# Patient Record
Sex: Male | Born: 1995 | Race: Black or African American | Hispanic: No | Marital: Single | Smoking: Current some day smoker
Health system: Southern US, Community
[De-identification: ages and names within clinical notes are randomized; demographics above are authoritative.]

## PROBLEM LIST (undated history)

## (undated) DIAGNOSIS — S86012A Strain of left Achilles tendon, initial encounter: Secondary | ICD-10-CM

## (undated) DIAGNOSIS — T7840XA Allergy, unspecified, initial encounter: Secondary | ICD-10-CM

## (undated) DIAGNOSIS — J45909 Unspecified asthma, uncomplicated: Secondary | ICD-10-CM

## (undated) DIAGNOSIS — F329 Major depressive disorder, single episode, unspecified: Secondary | ICD-10-CM

## (undated) DIAGNOSIS — F988 Other specified behavioral and emotional disorders with onset usually occurring in childhood and adolescence: Secondary | ICD-10-CM

## (undated) DIAGNOSIS — F32A Depression, unspecified: Secondary | ICD-10-CM

## (undated) DIAGNOSIS — F419 Anxiety disorder, unspecified: Secondary | ICD-10-CM

## (undated) HISTORY — DX: Depression, unspecified: F32.A

## (undated) HISTORY — DX: Other specified behavioral and emotional disorders with onset usually occurring in childhood and adolescence: F98.8

## (undated) HISTORY — DX: Major depressive disorder, single episode, unspecified: F32.9

## (undated) HISTORY — PX: SHOULDER ARTHROSCOPY W/ LABRAL REPAIR: SHX2399

## (undated) HISTORY — DX: Anxiety disorder, unspecified: F41.9

---
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
## 2013-01-04 ENCOUNTER — Telehealth: Payer: Self-pay | Admitting: Nurse Practitioner

## 2013-01-04 MED ORDER — LISDEXAMFETAMINE DIMESYLATE 50 MG PO CAPS: 50.0000 mg | ORAL_CAPSULE | ORAL | Status: DC

## 2013-01-04 NOTE — Telephone Encounter (Signed)
Aware. 

## 2013-01-04 NOTE — Telephone Encounter (Signed)
rx ready for pick up- government controlled and we cannot send electronically nor call in

## 2013-03-23 NOTE — Telephone Encounter (Signed)
11/11 Patient's RX for Vyvanse was sent to front and notified by Baruch GoutyJanie Stone

## 2013-03-24 ENCOUNTER — Telehealth: Payer: Self-pay | Admitting: Nurse Practitioner

## 2013-03-24 MED ORDER — LISDEXAMFETAMINE DIMESYLATE 50 MG PO CAPS: 50.0000 mg | ORAL_CAPSULE | ORAL | Status: DC

## 2013-03-24 NOTE — Telephone Encounter (Signed)
Mom here to pick up rx  

## 2013-05-09 ENCOUNTER — Telehealth: Payer: Self-pay | Admitting: Nurse Practitioner

## 2013-05-09 NOTE — Telephone Encounter (Signed)
appt given  

## 2013-05-09 NOTE — Telephone Encounter (Signed)
ntbs befroe refill- has not been seen for adhd in almost a year

## 2013-05-11 ENCOUNTER — Ambulatory Visit (INDEPENDENT_AMBULATORY_CARE_PROVIDER_SITE_OTHER): Payer: BC Managed Care – PPO | Admitting: Nurse Practitioner

## 2013-05-11 ENCOUNTER — Encounter: Payer: Self-pay | Admitting: Nurse Practitioner

## 2013-05-11 VITALS — BP 128/79 | HR 69 | Temp 97.3°F | Ht 70.25 in | Wt 169.8 lb

## 2013-05-11 DIAGNOSIS — Z1389 Encounter for screening for other disorder: Principal | ICD-10-CM

## 2013-05-11 DIAGNOSIS — Z1339 Encounter for screening examination for other mental health and behavioral disorders: Secondary | ICD-10-CM

## 2013-05-11 DIAGNOSIS — F418 Other specified anxiety disorders: Secondary | ICD-10-CM

## 2013-05-11 DIAGNOSIS — F341 Dysthymic disorder: Secondary | ICD-10-CM

## 2013-05-11 DIAGNOSIS — F909 Attention-deficit hyperactivity disorder, unspecified type: Secondary | ICD-10-CM

## 2013-05-11 MED ORDER — BUPROPION HCL ER (XL) 150 MG PO TB24: 150.0000 mg | ORAL_TABLET | Freq: Two times a day (BID) | ORAL | Status: DC

## 2013-05-11 MED ORDER — LISDEXAMFETAMINE DIMESYLATE 50 MG PO CAPS: 50.0000 mg | ORAL_CAPSULE | ORAL | Status: DC

## 2013-05-11 NOTE — Patient Instructions (Signed)
Attention Deficit Hyperactivity Disorder Attention deficit hyperactivity disorder (ADHD) is a problem with behavior issues based on the way the brain functions (neurobehavioral disorder). It is a common reason for behavior and academic problems in school. SYMPTOMS  There are 3 types of ADHD. The 3 types and some of the symptoms include:  Inattentive  Gets bored or distracted easily.  Loses or forgets things. Forgets to hand in homework.  Has trouble organizing or completing tasks.  Difficulty staying on task.  An inability to organize daily tasks and school work.  Leaving projects, chores, or homework unfinished.  Trouble paying attention or responding to details. Careless mistakes.  Difficulty following directions. Often seems like is not listening.  Dislikes activities that require sustained attention (like chores or homework).  Hyperactive-impulsive  Feels like it is impossible to sit still or stay in a seat. Fidgeting with hands and feet.  Trouble waiting turn.  Talking too much or out of turn. Interruptive.  Speaks or acts impulsively.  Aggressive, disruptive behavior.  Constantly busy or on the go, noisy.  Often leaves seat when they are expected to remain seated.  Often runs or climbs where it is not appropriate, or feels very restless.  Combined  Has symptoms of both of the above. Often children with ADHD feel discouraged about themselves and with school. They often perform well below their abilities in school. As children get older, the excess motor activities can calm down, but the problems with paying attention and staying organized persist. Most children do not outgrow ADHD but with good treatment can learn to cope with the symptoms. DIAGNOSIS  When ADHD is suspected, the diagnosis should be made by professionals trained in ADHD. This professional will collect information about the individual suspected of having ADHD. Information must be collected from  various settings where the person lives, works, or attends school.  Diagnosis will include:  Confirming symptoms began in childhood.  Ruling out other reasons for the child's behavior.  The health care providers will check with the child's school and check their medical records.  They will talk to teachers and parents.  Behavior rating scales for the child will be filled out by those dealing with the child on a daily basis. A diagnosis is made only after all information has been considered. TREATMENT  Treatment usually includes behavioral treatment, tutoring or extra support in school, and stimulant medicines. Because of the way a person's brain works with ADHD, these medicines decrease impulsivity and hyperactivity and increase attention. This is different than how they would work in a person who does not have ADHD. Other medicines used include antidepressants and certain blood pressure medicines. Most experts agree that treatment for ADHD should address all aspects of the person's functioning. Along with medicines, treatment should include structured classroom management at school. Parents should reward good behavior, provide constant discipline, and limit-setting. Tutoring should be available for the child as needed. ADHD is a life-long condition. If untreated, the disorder can have long-term serious effects into adolescence and adulthood. HOME CARE INSTRUCTIONS   Often with ADHD there is a lot of frustration among family members dealing with the condition. Blame and anger are also feelings that are common. In many cases, because the problem affects the family as a whole, the entire family may need help. A therapist can help the family find better ways to handle the disruptive behaviors of the person with ADHD and promote change. If the person with ADHD is young, most of the therapist's work   is with the parents. Parents will learn techniques for coping with and improving their child's  behavior. Sometimes only the child with the ADHD needs counseling. Your health care providers can help you make these decisions.  Children with ADHD may need help learning how to organize. Some helpful tips include:  Keep routines the same every day from wake-up time to bedtime. Schedule all activities, including homework and playtime. Keep the schedule in a place where the person with ADHD will often see it. Mark schedule changes as far in advance as possible.  Schedule outdoor and indoor recreation.  Have a place for everything and keep everything in its place. This includes clothing, backpacks, and school supplies.  Encourage writing down assignments and bringing home needed books. Work with your child's teachers for assistance in organizing school work.  Offer your child a well-balanced diet. Breakfast that includes a balance of whole grains, protein and, fruits or vegetables is especially important for school performance. Children should avoid drinks with caffeine including:  Soft drinks.  Coffee.  Tea.  However, some older children (adolescents) may find these drinks helpful in improving their attention. Because it can also be common for adolescents with ADHD to become addicted to caffeine, talk with your health care provider about what is a safe amount of caffeine intake for your child.  Children with ADHD need consistent rules that they can understand and follow. If rules are followed, give small rewards. Children with ADHD often receive, and expect, criticism. Look for good behavior and praise it. Set realistic goals. Give clear instructions. Look for activities that can foster success and self-esteem. Make time for pleasant activities with your child. Give lots of affection.  Parents are their children's greatest advocates. Learn as much as possible about ADHD. This helps you become a stronger and better advocate for your child. It also helps you educate your child's teachers and  instructors if they feel inadequate in these areas. Parent support groups are often helpful. A national group with local chapters is called Children and Adults with Attention Deficit Hyperactivity Disorder (CHADD). SEEK MEDICAL CARE IF:  Your child has repeated muscle twitches, cough or speech outbursts.  Your child has sleep problems.  Your child has a marked loss of appetite.  Your child develops depression.  Your child has new or worsening behavioral problems.  Your child develops dizziness.  Your child has a racing heart.  Your child has stomach pains.  Your child develops headaches. SEEK IMMEDIATE MEDICAL CARE IF:  Your child has been diagnosed with depression or anxiety and the symptoms seem to be getting worse.  Your child has been depressed and suddenly appears to have increased energy or motivation.  You are worried that your child is having a bad reaction to a medication he or she is taking for ADHD. Document Released: 02/06/2002 Document Revised: 12/07/2012 Document Reviewed: 10/24/2012 Exit[employer] Patient Information 2014 [employer].  

## 2013-05-11 NOTE — Progress Notes (Addendum)
   Subjective:    Patient ID: Ray Chavez, male    DOB: 1995-07-04, 18 y.o.   MRN: [id number]  HPI Patient brought in today by himself for follow up of ADHA. Currently taking vyvanse 50mg  and  daily. Behavior- good Grades-good Medication side effects-none Weight loss- none Sleeping habits- good Any concerns- none  Also depression- takes wellbutrin daily- no side effects keeps him from feelong down and depressed.    Review of Systems  Constitutional: Negative.   HENT: Negative.   Respiratory: Negative.   Cardiovascular: Negative.   Hematological: Negative.   All other systems reviewed and are negative.       Objective:   Physical Exam  Constitutional: He appears well-developed and well-nourished.  Cardiovascular: Normal rate, regular rhythm and normal heart sounds.   Pulmonary/Chest: Effort normal and breath sounds normal.  Skin: Skin is warm.  Psychiatric: He has a normal mood and affect. His behavior is normal. Judgment and thought content normal.    BP 128/79  Pulse 69  Temp(Src) 97.3 F (36.3 C) (Oral)  Ht 5' 10.25" (1.784 m)  Wt 169 lb 12.8 oz (77.021 kg)  BMI 24.20 kg/m2       Assessment & Plan:   1. ADHD (attention deficit hyperactivity disorder) evaluation   2. Depression with anxiety    Meds ordered this encounter  Medications  . lisdexamfetamine (VYVANSE) 50 MG capsule    Sig: Take 1 capsule (50 mg total) by mouth every morning.    Dispense:  30 capsule    Refill:  0    Do not fill till 06/10/13    Order Specific Question:  Supervising Provider    Answer:  Ernestina PennaMOORE, DONALD W [[id number]]  . buPROPion (WELLBUTRIN XL) 150 MG 24 hr tablet    Sig: Take 1 tablet (150 mg total) by mouth 2 (two) times daily.    Dispense:  30 tablet    Refill:  3    Order Specific Question:  Supervising Provider    Answer:  Ernestina PennaMOORE, DONALD W [[id number]]  . lisdexamfetamine (VYVANSE) 50 MG capsule    Sig: Take 1 capsule (50 mg total) by mouth every morning.    Dispense:  30  capsule    Refill:  0    Order Specific Question:  Supervising Provider    Answer:  Deborra MedinaMOORE, DONALD W [[id number]]   Meds as prescribed Behavior modification as needed Follow-up for recheck in 2 months Ray Daphine DeutscherMartin, FNP

## 2013-06-15 ENCOUNTER — Telehealth: Payer: Self-pay | Admitting: Nurse Practitioner

## 2013-06-15 MED ORDER — BUPROPION HCL ER (XL) 150 MG PO TB24: 150.0000 mg | ORAL_TABLET | Freq: Two times a day (BID) | ORAL | Status: DC

## 2013-07-28 ENCOUNTER — Other Ambulatory Visit: Payer: Self-pay

## 2013-07-28 MED ORDER — FLUOXETINE HCL 20 MG PO TABS: 20.0000 mg | ORAL_TABLET | Freq: Every day | ORAL | Status: DC

## 2013-07-28 NOTE — Telephone Encounter (Signed)
Last seen 05/11/13 MMM This med not in [facility] list

## 2013-07-31 ENCOUNTER — Other Ambulatory Visit: Payer: Self-pay | Admitting: Nurse Practitioner

## 2013-09-12 ENCOUNTER — Ambulatory Visit (INDEPENDENT_AMBULATORY_CARE_PROVIDER_SITE_OTHER): Payer: Self-pay | Admitting: Nurse Practitioner

## 2013-09-12 VITALS — BP 122/78 | HR 63 | Temp 97.3°F | Ht 70.28 in

## 2013-09-12 DIAGNOSIS — M25512 Pain in left shoulder: Secondary | ICD-10-CM

## 2013-09-12 DIAGNOSIS — Z7251 High risk heterosexual behavior: Secondary | ICD-10-CM

## 2013-09-12 DIAGNOSIS — M25519 Pain in unspecified shoulder: Secondary | ICD-10-CM

## 2013-09-12 DIAGNOSIS — R5381 Other malaise: Secondary | ICD-10-CM

## 2013-09-12 DIAGNOSIS — R5383 Other fatigue: Secondary | ICD-10-CM

## 2013-09-12 DIAGNOSIS — R5382 Chronic fatigue, unspecified: Secondary | ICD-10-CM

## 2013-09-12 NOTE — Progress Notes (Signed)
   Subjective:    Patient ID: Ray Chavez, male    DOB: [birth year], 18 y.o.   MRN: [id number]  HPI Patient in with mu;tiple complaints: * left shoulder tingling - like pins and needles sticking in it- had torn labrium surgery in thst shoulder over a year ago- This current sensation started about 2 months ago- getting worse-constant- movement makes it some worse.- neck pian and headaches when turns head to the left- no OTC meds * Fatigue- stays that way all the time- started about 3-4 months ago- sleeps about 7-8 hours a night- mom says that he snores. *STD- high risk sexual behavior- mom wants him checked.   Review of Systems  Constitutional: Positive for appetite change (less) and fatigue. Negative for fever and chills.  Respiratory: Negative.   Cardiovascular: Negative.   Gastrointestinal: Positive for diarrhea.  Musculoskeletal: Positive for myalgias and neck pain.  Neurological: Positive for headaches.  Psychiatric/Behavioral: Negative.   All other systems reviewed and are negative.      Objective:   Physical Exam  Constitutional: He is oriented to person, place, and time. He appears well-developed and well-nourished.  HENT:  Right Ear: External ear normal.  Left Ear: External ear normal.  Eyes: Pupils are equal, round, and reactive to light.  Neck: Normal range of motion.  Cardiovascular: Normal rate, regular rhythm and normal heart sounds.   Pulmonary/Chest: Effort normal and breath sounds normal.  Abdominal: Soft. Bowel sounds are normal.  Musculoskeletal:  FROM of left shoulder with tingling sensation on internal and external rotation. Grips equal bil  Neurological: He is alert and oriented to person, place, and time. He has normal reflexes.  Skin: Skin is warm and dry.  Psychiatric: He has a normal mood and affect. His behavior is normal. Judgment and thought content normal.   BP 122/78  Pulse 63  Temp(Src) 97.3 F (36.3 C) (Oral)  Ht 5' 10.28" (1.785 m)  SpO2  97%        Assessment & Plan:  1. Chronic fatigue Labs pending Rest Force fluids - Anemia Profile B - CMP14+EGFR - Lyme Ab/Western Blot Reflex - Rocky mtn spotted fvr abs pnl(IgG+IgM) - Thyroid Panel With TSH - Epstein-Barr virus VCA antibody panel  2. Left shoulder pain If hurts don't do it - Ambulatory referral to Orthopedic Surgery  3. High risk sexual behavior Labs pending - STD Panel (HBSAG,HIV,RPR)   Mary-Margaret Hassell Done, FNP

## 2013-09-12 NOTE — Patient Instructions (Signed)

## 2013-09-14 LAB — ANEMIA PROFILE B
BASOS ABS: 0 10*3/uL (ref 0.0–0.2)
Basos: 0 %
EOS ABS: 0.1 10*3/uL (ref 0.0–0.4)
Eos: 3 %
FERRITIN: 150 ng/mL — AB (ref 16–124)
Folate: 9.3 ng/mL (ref >3.0)
HCT: 45.7 % (ref 37.5–51.0)
Hemoglobin: 15.8 g/dL (ref 12.6–17.7)
IMMATURE GRANULOCYTES: 0 %
IRON SATURATION: 57 % — AB (ref 15–55)
IRON: 181 ug/dL — AB (ref 40–155)
Immature Grans (Abs): 0 10*3/uL (ref 0.0–0.1)
LYMPHS ABS: 1.9 10*3/uL (ref 0.7–3.1)
Lymphs: 39 %
MCH: 31.1 pg (ref 26.6–33.0)
MCHC: 34.6 g/dL (ref 31.5–35.7)
MCV: 90 fL (ref 79–97)
MONOCYTES: 9 %
MONOS ABS: 0.4 10*3/uL (ref 0.1–0.9)
Neutrophils Absolute: 2.3 10*3/uL (ref 1.4–7.0)
Neutrophils Relative %: 49 %
PLATELETS: 237 10*3/uL (ref 150–379)
RBC: 5.08 x10E6/uL (ref 4.14–5.80)
RDW: 12.6 % (ref 12.3–15.4)
Retic Ct Pct: 1.3 % (ref 0.6–2.6)
TIBC: 317 ug/dL (ref 250–450)
UIBC: 136 ug/dL — ABNORMAL LOW (ref 150–375)
Vitamin B-12: 702 pg/mL (ref 211–946)
WBC: 4.8 10*3/uL (ref 3.4–10.8)

## 2013-09-14 LAB — CMP14+EGFR
A/G RATIO: 2.1 (ref 1.1–2.5)
ALK PHOS: 65 IU/L (ref 39–117)
ALT: 44 IU/L (ref 0–44)
AST: 40 IU/L (ref 0–40)
Albumin: 4.7 g/dL (ref 3.5–5.5)
BILIRUBIN TOTAL: 0.6 mg/dL (ref 0.0–1.2)
BUN / CREAT RATIO: 10 (ref 8–19)
BUN: 12 mg/dL (ref 6–20)
CHLORIDE: 99 mmol/L (ref 97–108)
CO2: 23 mmol/L (ref 18–29)
Calcium: 9.9 mg/dL (ref 8.7–10.2)
Creatinine, Ser: 1.22 mg/dL (ref 0.76–1.27)
GFR calc non Af Amer: 85 mL/min/{1.73_m2} (ref >59)
GFR, EST AFRICAN AMERICAN: 99 mL/min/{1.73_m2} (ref >59)
Globulin, Total: 2.2 g/dL (ref 1.5–4.5)
Glucose: 70 mg/dL (ref 65–99)
POTASSIUM: 4 mmol/L (ref 3.5–5.2)
Sodium: 140 mmol/L (ref 134–144)
Total Protein: 6.9 g/dL (ref 6.0–8.5)

## 2013-09-14 LAB — EPSTEIN-BARR VIRUS VCA ANTIBODY PANEL
EBV AB VCA, IGG: 28 U/mL — AB (ref 0.0–17.9)
EBV Early Antigen Ab, IgG: <9 U/mL (ref 0.0–8.9)
EBV NUCLEAR ANTIGEN AB, IGG: 77.3 U/mL — AB (ref 0.0–17.9)

## 2013-09-14 LAB — ROCKY MTN SPOTTED FVR ABS PNL(IGG+IGM)
RMSF IgG: NEGATIVE
RMSF IgM: 0.4 index (ref 0.00–0.89)

## 2013-09-14 LAB — LYME AB/WESTERN BLOT REFLEX: LYME DISEASE AB, QUANT, IGM: <0.8 index (ref 0.00–0.79)

## 2013-09-14 LAB — STD PANEL
HIV 1/O/2 Abs-Index Value: <1 (ref <1.00)
HIV-1/HIV-2 Ab: NONREACTIVE
Hepatitis B Surface Ag: NEGATIVE
RPR: NONREACTIVE

## 2013-09-14 LAB — THYROID PANEL WITH TSH
FREE THYROXINE INDEX: 1.8 (ref 1.2–4.9)
T3 Uptake Ratio: 28 % (ref 24–39)
T4 TOTAL: 6.4 ug/dL (ref 4.5–12.0)
TSH: 2.63 u[IU]/mL (ref 0.450–4.500)

## 2013-09-15 ENCOUNTER — Telehealth: Payer: Self-pay | Admitting: Family Medicine

## 2013-09-15 NOTE — Telephone Encounter (Signed)
Message copied by Azalee CourseFULP, Dorethea Strubel on Fri Sep 15, 2013 10:02 AM ------      Message from: Bennie PieriniMARTIN, MARY-MARGARET      Created: Fri Sep 15, 2013  9:19 AM       Anemia panel normal      Kidney and liver function stable      Thyroid normal      Lyme negative      RMSF negative      Mono titer show positive- but looks like he has had for awhile and is resolving      STD panel negative ------

## 2013-09-21 ENCOUNTER — Encounter: Payer: Self-pay | Admitting: *Deleted

## 2013-09-26 ENCOUNTER — Telehealth: Payer: Self-pay | Admitting: Nurse Practitioner

## 2013-09-26 NOTE — Telephone Encounter (Signed)
Patient aware of labs.  

## 2013-09-26 NOTE — Telephone Encounter (Signed)
Ac lmtcb 7/28

## 2013-09-27 ENCOUNTER — Other Ambulatory Visit: Payer: Self-pay | Admitting: Nurse Practitioner

## 2013-12-13 ENCOUNTER — Other Ambulatory Visit: Payer: Self-pay | Admitting: Nurse Practitioner

## 2013-12-14 NOTE — Telephone Encounter (Signed)
Pt has Rx for Wellbutrin and Prozac. Last ov 7/15 with labs.

## 2014-04-17 ENCOUNTER — Telehealth: Payer: Self-pay | Admitting: Nurse Practitioner

## 2014-04-17 NOTE — Telephone Encounter (Signed)
Just schedule whenever you can

## 2014-04-17 NOTE — Telephone Encounter (Signed)
Is it ok to schedule him one night you are working late.

## 2014-04-17 NOTE — Telephone Encounter (Signed)
Pt's mother called back, given appt with MMM 2/23 at 5:45.

## 2014-04-24 ENCOUNTER — Encounter: Payer: Self-pay | Admitting: Nurse Practitioner

## 2014-04-24 ENCOUNTER — Ambulatory Visit (INDEPENDENT_AMBULATORY_CARE_PROVIDER_SITE_OTHER): Payer: Managed Care, Other (non HMO) | Admitting: Nurse Practitioner

## 2014-04-24 VITALS — BP 134/88 | HR 80 | Temp 98.3°F | Ht 70.25 in | Wt 188.6 lb

## 2014-04-24 DIAGNOSIS — Z1339 Encounter for screening examination for other mental health and behavioral disorders: Secondary | ICD-10-CM

## 2014-04-24 DIAGNOSIS — Z1389 Encounter for screening for other disorder: Principal | ICD-10-CM

## 2014-04-24 DIAGNOSIS — F909 Attention-deficit hyperactivity disorder, unspecified type: Secondary | ICD-10-CM

## 2014-04-24 MED ORDER — LISDEXAMFETAMINE DIMESYLATE 40 MG PO CAPS: 40.0000 mg | ORAL_CAPSULE | ORAL | Status: AC

## 2014-04-24 NOTE — Progress Notes (Signed)
   Subjective:    Patient ID: Ray Chavez, male    DOB: [birth year], 19 y.o.   MRN: [id number]  HPI Patient brought in today by self for follow up of ADHD. Currently taking vyvanse50mg - has not taken in several months.Starting to have problems in school. Behavior-good Grades- dropping Medication side effects- none Weight loss- when takes [employer] Sleeping habits- good Any concerns- thinks that may need to go down in dose- thinks was a little strong for him.     Review of Systems  Constitutional: Negative.   HENT: Negative.   Respiratory: Negative.   Cardiovascular: Negative.   Gastrointestinal: Negative.   Genitourinary: Negative.   Neurological: Negative.   Psychiatric/Behavioral: Negative.   All other systems reviewed and are negative.      Objective:   Physical Exam  Constitutional: He is oriented to person, place, and time. He appears well-developed and well-nourished.  Cardiovascular: Normal rate, regular rhythm and normal heart sounds.   Pulmonary/Chest: Effort normal and breath sounds normal.  Neurological: He is alert and oriented to person, place, and time.  Skin: Skin is warm and dry.  Psychiatric: He has a normal mood and affect. His behavior is normal. Judgment and thought content normal.     BP 134/88 mmHg  Pulse 80  Temp(Src) 98.3 F (36.8 C) (Oral)  Ht 5' 10.25" (1.784 m)  Wt 188 lb 9.6 oz (85.548 kg)  BMI 26.88 kg/m2       Assessment & Plan:   1. ADHD (attention deficit hyperactivity disorder) evaluation    Meds ordered this encounter  Medications  . lisdexamfetamine (VYVANSE) 40 MG capsule    Sig: Take 1 capsule (40 mg total) by mouth every morning.    Dispense:  30 capsule    Refill:  0    Order Specific Question:  Supervising Provider    Answer:  Ernestina PennaMOORE, DONALD W [[id number]]  . lisdexamfetamine (VYVANSE) 40 MG capsule    Sig: Take 1 capsule (40 mg total) by mouth every morning.    Dispense:  30 capsule    Refill:  0    DO NOT FILL TILL  05/22/14    Order Specific Question:  Supervising Provider    Answer:  Ernestina PennaMOORE, DONALD W [[id number]]   Stress management rto in 2 months  Mary-Margaret Daphine DeutscherMartin, FNP

## 2015-07-30 ENCOUNTER — Telehealth: Payer: Self-pay | Admitting: Nurse Practitioner

## 2015-07-31 NOTE — Telephone Encounter (Signed)
Faxed shot record to [facility] @ [phone] and left copy of shot record left up front

## 2019-10-06 ENCOUNTER — Emergency Department (HOSPITAL_COMMUNITY)
Admission: EM | Admit: 2019-10-06 | Discharge: 2019-10-07 | Disposition: A | Discharge status: Home / Self Care | Payer: Managed Care, Other (non HMO) | Attending: Emergency Medicine | Admitting: Emergency Medicine

## 2019-10-06 ENCOUNTER — Emergency Department (HOSPITAL_COMMUNITY): Payer: Managed Care, Other (non HMO)

## 2019-10-06 ENCOUNTER — Encounter (HOSPITAL_COMMUNITY): Payer: Self-pay | Admitting: Emergency Medicine

## 2019-10-06 ENCOUNTER — Other Ambulatory Visit: Payer: Self-pay

## 2019-10-06 DIAGNOSIS — J1282 Pneumonia due to coronavirus disease 2019: Secondary | ICD-10-CM | POA: Insufficient documentation

## 2019-10-06 DIAGNOSIS — R45851 Suicidal ideations: Secondary | ICD-10-CM

## 2019-10-06 DIAGNOSIS — R079 Chest pain, unspecified: Secondary | ICD-10-CM | POA: Diagnosis present

## 2019-10-06 DIAGNOSIS — U071 COVID-19: Secondary | ICD-10-CM | POA: Diagnosis not present

## 2019-10-06 DIAGNOSIS — F17200 Nicotine dependence, unspecified, uncomplicated: Secondary | ICD-10-CM | POA: Insufficient documentation

## 2019-10-06 DIAGNOSIS — J45909 Unspecified asthma, uncomplicated: Secondary | ICD-10-CM | POA: Insufficient documentation

## 2019-10-06 LAB — BASIC METABOLIC PANEL
Anion gap: 14 (ref 5–15)
BUN: 10 mg/dL (ref 6–20)
CO2: 23 mmol/L (ref 22–32)
Calcium: 9.3 mg/dL (ref 8.9–10.3)
Chloride: 96 mmol/L — ABNORMAL LOW (ref 98–111)
Creatinine, Ser: 1.28 mg/dL — ABNORMAL HIGH (ref 0.61–1.24)
GFR calc Af Amer: >60 mL/min (ref >60)
GFR calc non Af Amer: >60 mL/min (ref >60)
Glucose, Bld: 100 mg/dL — ABNORMAL HIGH (ref 70–99)
Potassium: 3.5 mmol/L (ref 3.5–5.1)
Sodium: 133 mmol/L — ABNORMAL LOW (ref 135–145)

## 2019-10-06 LAB — CBC
HCT: 47 % (ref 39.0–52.0)
Hemoglobin: 16.6 g/dL (ref 13.0–17.0)
MCH: 31.6 pg (ref 26.0–34.0)
MCHC: 35.3 g/dL (ref 30.0–36.0)
MCV: 89.5 fL (ref 80.0–100.0)
Platelets: 177 10*3/uL (ref 150–400)
RBC: 5.25 MIL/uL (ref 4.22–5.81)
RDW: 11.8 % (ref 11.5–15.5)
WBC: 5.2 10*3/uL (ref 4.0–10.5)
nRBC: 0 % (ref 0.0–0.2)

## 2019-10-06 LAB — TROPONIN I (HIGH SENSITIVITY): Troponin I (High Sensitivity): 5 ng/L (ref <18)

## 2019-10-06 MED ORDER — SODIUM CHLORIDE 0.9% FLUSH: 3.0000 mL | Freq: Once | INTRAVENOUS | Status: DC

## 2019-10-06 NOTE — ED Triage Notes (Signed)
Pt c/o chest pain, shortness of breath, cough and nausea/vomiting x 5 days. States he was tested for covid last week, and was negative. Denies covid contacts.

## 2019-10-07 DIAGNOSIS — F322 Major depressive disorder, single episode, severe without psychotic features: Secondary | ICD-10-CM | POA: Insufficient documentation

## 2019-10-07 LAB — SARS CORONAVIRUS 2 BY RT PCR (HOSPITAL ORDER, PERFORMED IN ~~LOC~~ HOSPITAL LAB): SARS Coronavirus 2: POSITIVE — AB

## 2019-10-07 LAB — TROPONIN I (HIGH SENSITIVITY): Troponin I (High Sensitivity): 4 ng/L (ref <18)

## 2019-10-07 MED ORDER — FAMOTIDINE IN NACL 20-0.9 MG/50ML-% IV SOLN: 20.0000 mg | Freq: Once | INTRAVENOUS | Status: DC | PRN

## 2019-10-07 MED ORDER — SODIUM CHLORIDE 0.9 % IV SOLN
1200.0000 mg | Freq: Once | INTRAVENOUS | Status: AC
  Administered 2019-10-07: 1200 mg via INTRAVENOUS
  Filled 2019-10-07: qty 10

## 2019-10-07 MED ORDER — ONDANSETRON 4 MG PO TBDP
4.0000 mg | ORAL_TABLET | Freq: Once | ORAL | Status: AC
  Administered 2019-10-07: 4 mg via ORAL
  Filled 2019-10-07: qty 1

## 2019-10-07 MED ORDER — METHYLPREDNISOLONE SODIUM SUCC 125 MG IJ SOLR: 125.0000 mg | Freq: Once | INTRAMUSCULAR | Status: DC | PRN

## 2019-10-07 MED ORDER — LORAZEPAM 2 MG/ML IJ SOLN
0.5000 mg | Freq: Once | INTRAMUSCULAR | Status: AC
  Administered 2019-10-07: 0.5 mg via INTRAVENOUS
  Filled 2019-10-07: qty 1

## 2019-10-07 MED ORDER — ALBUTEROL SULFATE HFA 108 (90 BASE) MCG/ACT IN AERS: 2.0000 | INHALATION_SPRAY | RESPIRATORY_TRACT | Status: DC | PRN

## 2019-10-07 MED ORDER — EPINEPHRINE 0.3 MG/0.3ML IJ SOAJ: 0.3000 mg | Freq: Once | INTRAMUSCULAR | Status: DC | PRN

## 2019-10-07 MED ORDER — HYDROXYZINE HCL 25 MG PO TABS
25.0000 mg | ORAL_TABLET | Freq: Four times a day (QID) | ORAL | 0 refills | Status: AC
Start: 2019-10-07 — End: ?

## 2019-10-07 MED ORDER — DOXYCYCLINE HYCLATE 100 MG PO TABS
100.0000 mg | ORAL_TABLET | Freq: Once | ORAL | Status: AC
  Administered 2019-10-07: 100 mg via ORAL
  Filled 2019-10-07: qty 1

## 2019-10-07 MED ORDER — DOXYCYCLINE HYCLATE 100 MG PO CAPS
100.0000 mg | ORAL_CAPSULE | Freq: Two times a day (BID) | ORAL | 0 refills | Status: DC
Start: 2019-10-07 — End: 2019-10-07

## 2019-10-07 MED ORDER — ALBUTEROL SULFATE HFA 108 (90 BASE) MCG/ACT IN AERS
2.0000 | INHALATION_SPRAY | Freq: Once | RESPIRATORY_TRACT | Status: AC | PRN
  Administered 2019-10-07: 2 via RESPIRATORY_TRACT
  Filled 2019-10-07: qty 6.7

## 2019-10-07 MED ORDER — DIPHENHYDRAMINE HCL 50 MG/ML IJ SOLN
50.0000 mg | Freq: Once | INTRAMUSCULAR | Status: AC | PRN
  Administered 2019-10-07: 50 mg via INTRAVENOUS
  Filled 2019-10-07: qty 1

## 2019-10-07 MED ORDER — SODIUM CHLORIDE 0.9 % IV SOLN: INTRAVENOUS | Status: DC | PRN

## 2019-10-07 NOTE — ED Provider Notes (Addendum)
[facility] EMERGENCY DEPARTMENT Provider Note  CSN: [id number] Arrival date & time: 10/06/19 2021  Chief Complaint(s) Chest Pain  HPI Ray Chavez is a 24 y.o. male who presents to the emergency department with 1 week of cough, chills, myalgia, posttussive chest pain and shortness of breath with nausea and nonbloody nonbilious emesis.  Patient reports decreased oral intake due to emesis.  No other alleviating or aggravating factors.  Patient denies any overt fevers.  Denies any known sick contacts.  Chest pain is nonradiating nonexertional.  Cough is nonproductive.  No abdominal pain or diarrhea.  No urinary symptoms.  No other physical complaints.  HPI  Past Medical History Past Medical History:  Diagnosis Date  . Achilles rupture, left 09/25/2011  . ADD (attention deficit disorder)   . Allergy   . Anxiety   . Asthma    exercise induced  . Depression    Patient Active Problem List   Diagnosis Date Noted  . Depression with anxiety 06/13/2012  . ADHD (attention deficit hyperactivity disorder) evaluation 05/30/2012  . Achilles rupture, left 09/25/2011   Home Medication(s) Prior to Admission medications   Medication Sig Start Date End Date Taking? Authorizing Provider  buPROPion (WELLBUTRIN XL) 150 MG 24 hr tablet Take 1 tablet (150 mg total) by mouth 2 (two) times daily. Patient not taking: Reported on 04/24/2014 06/15/13   Bennie Pierini, FNP  FLUoxetine (PROZAC) 20 MG capsule TAKE 1 CAPSULE EVERY DAY Patient not taking: Reported on 04/24/2014 12/14/13   Bennie Pierini, FNP  lisdexamfetamine (VYVANSE) 40 MG capsule Take 1 capsule (40 mg total) by mouth every morning. Patient not taking: Reported on 10/07/2019 04/24/14   Bennie Pierini, FNP  lisdexamfetamine (VYVANSE) 40 MG capsule Take 1 capsule (40 mg total) by mouth every morning. Patient not taking: Reported on 10/07/2019 04/24/14   Bennie Pierini, FNP                                                                                                                                     Past Surgical History Past Surgical History:  Procedure Laterality Date  . ACHILLES TENDON SURGERY  09/25/2011   Procedure: ACHILLES TENDON REPAIR;  Surgeon: Eulas Post, MD;  Location: [facility];  Service: Orthopedics;  Laterality: Left;  reair left achilles tendon primary open  . SHOULDER ARTHROSCOPY W/ LABRAL REPAIR     lt 04-2011   Family History Family History  Problem Relation Age of Onset  . Diabetes Father     Social History Social History   Tobacco Use  . Smoking status: Current Some Day Smoker  . Smokeless tobacco: Never Used  Substance Use Topics  . Alcohol use: Yes  . Drug use: No   Allergies Sulfa antibiotics  Review of Systems Review of Systems All other systems are reviewed and are negative for acute change except as noted in the HPI  Physical Exam Vital  Signs  I have reviewed the triage vital signs BP 122/77 (BP Location: Right Arm)   Pulse (!) 112   Temp 99.9 F (37.7 C) (Oral)   Resp (!) 22   SpO2 96%   Physical Exam Vitals reviewed.  Constitutional:      General: He is not in acute distress.    Appearance: He is well-developed. He is not diaphoretic.  HENT:     Head: Normocephalic and atraumatic.     Nose: Nose normal.  Eyes:     General: No scleral icterus.       Right eye: No discharge.        Left eye: No discharge.     Conjunctiva/sclera: Conjunctivae normal.     Pupils: Pupils are equal, round, and reactive to light.  Cardiovascular:     Rate and Rhythm: Normal rate and regular rhythm.     Heart sounds: No murmur heard.  No friction rub. No gallop.   Pulmonary:     Effort: Pulmonary effort is normal. No respiratory distress.     Breath sounds: Normal breath sounds. No stridor. No rales.  Abdominal:     General: There is no distension.     Palpations: Abdomen is soft.     Tenderness: There is no abdominal  tenderness.  Musculoskeletal:        General: No tenderness.     Cervical back: Normal range of motion and neck supple.  Skin:    General: Skin is warm and dry.     Findings: No erythema or rash.  Neurological:     Mental Status: He is alert and oriented to person, place, and time.     ED Results and Treatments Labs (all labs ordered are listed, but only abnormal results are displayed) Labs Reviewed  SARS CORONAVIRUS 2 BY RT PCR (HOSPITAL ORDER, PERFORMED IN [facility]) - Abnormal; Notable for the following components:      Result Value   SARS Coronavirus 2 POSITIVE (*)    All other components within normal limits  BASIC METABOLIC PANEL - Abnormal; Notable for the following components:   Sodium 133 (*)    Chloride 96 (*)    Glucose, Bld 100 (*)    Creatinine, Ser 1.28 (*)    All other components within normal limits  CBC  TROPONIN I (HIGH SENSITIVITY)  TROPONIN I (HIGH SENSITIVITY)                                                                                                                         EKG  EKG Interpretation  Date/Time:  Friday October 06 2019 20:29:41 EDT Ventricular Rate:  115 PR Interval:  126 QRS Duration: 84 QT Interval:  308 QTC Calculation: 426 R Axis:   62 Text Interpretation: Sinus tachycardia Possible Left atrial enlargement Borderline ECG No old tracing to compare Confirmed by Drema Pry [phone]) on 10/07/2019 4:19:46 AM      Radiology DG Chest Portable 1  View  Result Date: 10/06/2019 CLINICAL DATA:  Chest pain, shortness of breath, cough, nausea, and vomiting for 5 days. Negative COVID test last week EXAM: PORTABLE CHEST 1 VIEW COMPARISON:  None. FINDINGS: Normal heart size and pulmonary vascularity. Suggestion of mild linear infiltration in the right lung base possibly early pneumonia. No pleural effusions. Mediastinal contours appear intact. IMPRESSION: Suggestion of linear infiltration in the right lung base possibly early  pneumonia. Electronically Signed   By: Burman Nieves M.D.   On: 10/06/2019 21:04    Pertinent labs & imaging results that were available during my care of the patient were reviewed by me and considered in my medical decision making (see chart for details).  Medications Ordered in ED Medications  sodium chloride flush (NS) 0.9 % injection 3 mL (has no administration in time range)  doxycycline (VIBRA-TABS) tablet 100 mg (100 mg Oral Given 10/07/19 0442)  ondansetron (ZOFRAN-ODT) disintegrating tablet 4 mg (4 mg Oral Given 10/07/19 0442)                                                                                                                                    Procedures Procedures  (including critical care time)  Medical Decision Making / ED Course I have reviewed the nursing notes for this encounter and the patient's prior records (if available in EHR or on provided paperwork).   Librado Guandique was evaluated in Emergency Department on 10/07/2019 for the symptoms described in the history of present illness. He was evaluated in the context of the global COVID-19 pandemic, which necessitated consideration that the patient might be at risk for infection with the SARS-CoV-2 virus that causes COVID-19. Institutional protocols and algorithms that pertain to the evaluation of patients at risk for COVID-19 are in a state of rapid change based on information released by regulatory bodies including the CDC and federal and state organizations. These policies and algorithms were followed during the patient's care in the ED.  Patient presents with viral symptoms for 1 week. decreased oral hydration. Rest of history as above.  Patient appears well. No signs of toxicity, patient is interactive. No hypoxia, tachypnea or other signs of respiratory distress. No sign of clinical dehydration. Lung exam clear. Rest of exam as above.  CXR with streaky RLL opacity. Possible pneumonia. Patient not septic. Will  treat OP with doxy. COVID swab obtained.   Prior to DC mother came back and mentioned that patient wanted to speak about mental health issues. He reports intermittent depression and SI for several years, but more intense over the last few weeks. No prior suicide attempts. No HI or AVH. [facility] consulted for eval.  While awaiting, his COVID was +. No need for OP ABx now. Supportive management.      Final Clinical Impression(s) / ED Diagnoses Final diagnoses:  Lower respiratory tract infection due to COVID-19 virus  Suicidal ideation   The patient appears reasonably screened  and/or stabilized for discharge and I doubt any other medical condition or other [facility] requiring further screening, evaluation, or treatment in the ED at this time prior to discharge. Safe for discharge with strict return precautions.  Disposition: Discharge  Condition: Good  I have discussed the results, Dx and Tx plan with the patient/family who expressed understanding and agree(s) with the plan. Discharge instructions discussed at length. The patient/family was given strict return precautions who verbalized understanding of the instructions. No further questions at time of discharge.     Follow Up: Bennie PieriniMartin, Mary-Margaret, FNP [address] [place] [id number] [phone]  Schedule an appointment as soon as possible for a visit  in 3-5 days, If symptoms do not improve or  worsen      This chart was dictated using voice recognition software.  Despite best efforts to proofread,  errors can occur which can change the documentation meaning.       Nira Connardama, Latorria Zeoli Eduardo, MD 10/07/19 [phone]

## 2019-10-07 NOTE — ED Notes (Signed)
Patient verbalizes understanding of discharge instructions. Opportunity for questioning and answers were provided. Armband removed by staff, pt discharged from ED.  

## 2019-10-07 NOTE — ED Notes (Signed)
Pt verbally consented to MAB infusion. Dr. Rosalia Hammers witness to consent.

## 2019-10-07 NOTE — BH Assessment (Addendum)
Comprehensive Clinical Assessment (CCA) Note  10/07/2019 Ray Chavez [id number]  Patient presents to the [facility] stating that he has been struggling with depression and anxiety for a long time.  He states that his PCP has him on an SSRI.  Patient states that he has fleeting suicidal thoughts at times, but has never acted on them and states that he does not feel like he ever would.  He states that he had fleeting thoughts of wrecking his car a week ago. Patient states that he is under a lot of stress with his new job.  Patient states that he has never had any mental health therapy or hospitalization in the past.  Patient denies HI/Psychosis.  Patient states that he was drinking 4-5 beers 4-5 days a week, but states that he stopped drinking four days ago.  He states that his anxiety has increased since he stopped drinking and he states that he has been experiencing sleep disturbance.  Patient states that he has also experienced a decrease in his appetite and states that he has recently lost 20 pounds. Patient states that he has no history of abuse or self-mutilation.  Patient presents as alert and oriented, his thoughts are organized and his memory intact.  His judgment, insight and judgment are intact.  He does not appear to be responding to any internal stimuli.  Patient's mood is depressed and he is moderately anxious.  His speech is coherent and his eye contact is good.   Visit Diagnosis:      ICD-10-CM   1. Lower respiratory tract infection due to COVID-19 virus  U07.1    J22   2. Suicidal ideation  R45.851     3       Major Depressive Disorder Single Episode Severe  F32.2  CCA Screening, Triage and Referral (STR)  Patient Reported Information How did you hear about [place]? Self  Referral name: No data recorded Referral phone number: No data recorded  Whom do you see for routine medical problems? Primary Care  Practice/Facility Name: [facility]  Practice/Facility Phone Number:  No data recorded Name of Contact: No data recorded Contact Number: No data recorded Contact Fax Number: No data recorded Prescriber Name: No data recorded Prescriber Address (if known): No data recorded  What Is the Reason for Your Visit/Call Today? Patient states that he struggles with depression and anxiety and states that he has suicidal thoughts on occasion  How Long Has This Been Causing You Problems? > than 6 months  What Do You Feel Would Help You the Most Today? Therapy;Medication   Have You Recently Been in Any Inpatient Treatment (Hospital/Detox/Crisis Center/28-Day Program)? No  Name/Location of Program/Hospital:No data recorded How Long Were You There? No data recorded When Were You Discharged? No data recorded  Have You Ever Received Services From [facility] Before? No  Who Do You See at [facility]? No data recorded  Have You Recently Had Any Thoughts About Hurting Yourself? Yes  Are You Planning to Commit Suicide/Harm Yourself At This time? No (states that he had thoughts about wrecking his car a week ago)   Have you Recently Had Thoughts About Hurting Someone Karolee Ohs? No  Explanation: No data recorded  Have You Used Any Alcohol or Drugs in the Past 24 Hours? No  How Long Ago Did You Use Drugs or Alcohol? No data recorded What Did You Use and How Much? No data recorded  Do You Currently Have a Therapist/Psychiatrist? No  Name of Therapist/Psychiatrist: No  data recorded  Have You Been Recently Discharged From Any Office Practice or Programs? No  Explanation of Discharge From Practice/Program: No data recorded    CCA Screening Triage Referral Assessment Type of Contact: Tele-Assessment  Is this Initial or Reassessment? Initial Assessment  Date Telepsych consult ordered in [facility]:  10/07/19  Time Telepsych consult ordered in [facility]:  0558   Patient Reported Information Reviewed? Yes  Patient Left Without Being Seen? No data recorded Reason for Not  Completing Assessment: No data recorded  Collateral Involvement: TTS attempted to contact patient's mother, Jaquita Folds, several times to get collateral information, but was unsuccessful.   Does Patient Have a Automotive engineer Guardian? No data recorded Name and Contact of Legal Guardian: No data recorded If Minor and Not Living with Parent(s), Who has Custody? No data recorded Is CPS involved or ever been involved? Never  Is APS involved or ever been involved? Never   Patient Determined To Be At Risk for Harm To Self or Others Based on Review of Patient Reported Information or Presenting Complaint? Yes, for Self-Harm  Method: No data recorded Availability of Means: No data recorded Intent: No data recorded Notification Required: No data recorded Additional Information for Danger to Others Potential: No data recorded Additional Comments for Danger to Others Potential: No data recorded Are There Guns or Other Weapons in Your Home? No data recorded Types of Guns/Weapons: No data recorded Are These Weapons Safely Secured?                            No data recorded Who Could Verify You Are Able To Have These Secured: No data recorded Do You Have any Outstanding Charges, Pending Court Dates, Parole/Probation? No data recorded Contacted To Inform of Risk of Harm To Self or Others: Other: Comment (mother is aware of patient's suicidal thoughts)   Location of Assessment: [facility] ED   Does Patient Present under Involuntary Commitment? No  IVC Papers Initial File Date: No data recorded  [place] of Residence: [place]   Patient Currently Receiving the Following Services: Not Receiving Services   Determination of Need: Urgent (48 hours)   Options For Referral: Medication Management;Intensive Outpatient Therapy;Outpatient Therapy     CCA Biopsychosocial  Intake/Chief Complaint:  CCA Intake With Chief Complaint CCA Part Two Date: 10/07/19 CCA Part Two Time: 0926 Patient's  Currently Reported Symptoms/Problems: Patient states that he has been feeling anxious and depressed his whole life.  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 Type of Services Patient Feels Are Needed: Medication management and therapy  Mental Health Symptoms Depression:  Depression: Hopelessness, Increase/decrease in appetite, Sleep (too much or little), Weight gain/loss, Duration of symptoms greater than two weeks  Mania:  Mania: None  Anxiety:   Anxiety: Difficulty concentrating, Restlessness, Worrying, Tension  Psychosis:  Psychosis: None  Trauma:  Trauma: None  Obsessions:  Obsessions: None  Compulsions:  Compulsions: None  Inattention:  Inattention: None  Hyperactivity/Impulsivity:  Hyperactivity/Impulsivity: N/A  Oppositional/Defiant Behaviors:  Oppositional/Defiant Behaviors: None  Emotional Irregularity:  Emotional Irregularity: Potentially harmful impulsivity  Other Mood/Personality Symptoms:      Mental Status Exam Appearance and self-care  Stature:  Stature: Average  Weight:  Weight: Thin  Clothing:  Clothing: Neat/clean, Casual  Grooming:  Grooming: Well-groomed  Cosmetic use:  Cosmetic Use: None  Posture/gait:  Posture/Gait: Normal  Motor activity:  Motor Activity: Restless  Sensorium  Attention:  Attention: Normal  Concentration:  Concentration: Anxiety interferes  Orientation:  Orientation: Object, Person, Place, Situation, Time  Recall/memory:  Recall/Memory: Normal  Affect and Mood  Affect:  Affect: Depressed  Mood:  Mood: Anxious, Depressed  Relating  Eye contact:  Eye Contact: Normal  Facial expression:  Facial Expression: Depressed  Attitude toward examiner:  Attitude Toward Examiner: Cooperative   Thought and Language  Speech flow: Speech Flow: Clear and Coherent  Thought content:  Thought Content: Appropriate to Mood and Circumstances  Preoccupation:  Preoccupations: None  Hallucinations:  Hallucinations: None  Organization:     Company secretary of Knowledge:  Fund of Knowledge: Good  Intelligence:  Intelligence: Above Average  Abstraction:  Abstraction: Normal  Judgement:  Judgement: Good  Reality Testing:  Reality Testing: Realistic  Insight:  Insight: Good  Decision Making:  Decision Making: Impulsive  Social Functioning  Social Maturity:  Social Maturity: Responsible  Social Judgement:  Social Judgement: Normal  Stress  Stressors:  Stressors: Work  Coping Ability:  Coping Ability: Building surveyor Deficits:  Skill Deficits: None  Supports:  Supports: Family     Religion: Religion/Spirituality Are You A Religious Person?: No How Might This Affect Treatment?: N/A  Leisure/Recreation: Leisure / Recreation Do You Have Hobbies?: Yes Leisure and Hobbies: Working Photographer: Exercise/Diet Do You Exercise?: Yes What Type of Exercise Do You Do?: Other (Comment) (states that he works out) How Many Times a Week Do You Exercise?: 4-5 times a week Have You Gained or Lost A Significant Amount of Weight in the Past Six Months?: Yes-Lost Number of Pounds Lost?: 20 Do You Follow a Special Diet?: No Do You Have Any Trouble Sleeping?: Yes Explanation of Sleeping Difficulties: states that he only sleeps a few hours per night   CCA Employment/Education  Employment/Work Situation: Employment / Work Situation Employment situation: Employed Where is patient currently employed?: [employer] How long has patient been employed?: 1 month Patient's job has been impacted by current illness: No What is the longest time patient has a held a job?: 3 years at [employer] Has patient ever been in the [employer]?: No  Education: Education Is Patient Currently  Attending School?: No Last Grade Completed: 12 Name of High School: [employer] Did Theme park manager?: Yes What Type of College Degree Do you Have?: Patient has a degree in engineering Did You Attend Graduate School?: No Did You Have Any Special Interests In School?: engineering Did You Have An Individualized Education Program (IIEP): No Did You Have Any Difficulty At School?: No Patient's Education Has Been Impacted by Current Illness: No   CCA Family/Childhood History  Family and Relationship History: Family history Marital status: Single Are you sexually active?: Yes What is your sexual orientation?: heterosexual Has your sexual activity been affected by drugs, alcohol, medication, or emotional stress?: none reported Does patient have children?: No  Childhood History:  Childhood History By whom was/is the patient raised?: Mother (and stepfather) Additional childhood history information: Patient states that he was raised by his mother and stepfather.  Patient states that his father is an alcoholic Description of patient's relationship with caregiver when they were a child: Patient states that he is close to his mother, but not so close to his stepfather Patient's description of current relationship with people who raised him/her: Patient has a good relationship with  his mother, but he states that she does not unbderstand his depression How were you disciplined when you got in trouble as a child/adolescent?: Patient states that he was disciplined appropriately Does patient have siblings?: No Did patient suffer any verbal/emotional/physical/sexual abuse as a child?: No Did patient suffer from severe childhood neglect?: No Has patient ever been sexually abused/assaulted/raped as an adolescent or adult?: No Was the patient ever a victim of a crime or a disaster?: No Witnessed domestic violence?: No Has patient been affected by domestic violence as an adult?: No  Child/Adolescent  Assessment:     CCA Substance Use  Alcohol/Drug Use: Alcohol / Drug Use Pain Medications: see MAR Prescriptions: see MAR Over the Counter: see MAR History of alcohol / drug use?: Yes Longest period of sobriety (when/how long): patient states that he has not had any alcohol in four days Negative Consequences of Use: Personal relationships Withdrawal Symptoms:  (anxiety) Substance #1 Name of Substance 1: alcohol 1 - Age of First Use: 12 1 - Amount (size/oz): 4-5 beers 1 - Frequency: 4-5 days a week 1 - Duration: unknown 1 - Last Use / Amount: 4 days ago                       ASAM's:  Six Dimensions of Multidimensional Assessment  Dimension 1:  Acute Intoxication and/or Withdrawal Potential:   Dimension 1:  Description of individual's past and current experiences of substance use and withdrawal: Patient states that since ha has stopped drinking that he has been experienicning severe anxiety  Dimension 2:  Biomedical Conditions and Complications:   Dimension 2:  Description of patient's biomedical conditions and  complications: Patient states that he has no medical issues that are affected by his use of alcohol  Dimension 3:  Emotional, Behavioral, or Cognitive Conditions and Complications:  Dimension 3:  Description of emotional, behavioral, or cognitive conditions and complications: Patient states that he uses alcohol to self-medicate his depression and anxiety  Dimension 4:  Readiness to Change:  Dimension 4:  Description of Readiness to Change criteria: Patient states that he is ready to make positive changes in his life and to stop drinking.  Dimension 5:  Relapse, Continued use, or Continued Problem Potential:  Dimension 5:  Relapse, continued use, or continued problem potential critiera description: Patient is four days sober and struggling with withdrawal complications and he is at high risk for relapse  Dimension 6:  Recovery/Living Environment:  Dimension 6:   Recovery/Iiving environment criteria description: Patient states that he lives in a safe environment, but states that his family offers little support because they do not undertsand him  ASAM Severity Score: ASAM's Severity Rating Score: 8  ASAM Recommended Level of Treatment: ASAM Recommended Level of Treatment: Level II Intensive Outpatient Treatment   Substance use Disorder (SUD) Substance Use Disorder (SUD)  Checklist Symptoms of Substance Use: Continued use despite persistent or recurrent social, interpersonal problems, caused or exacerbated by use, Evidence of tolerance, Social, occupational, recreational activities given up or reduced due to use, Substance(s) often taken in larger amounts or over longer times than was intended  Recommendations for Services/Supports/Treatments: Recommendations for Services/Supports/Treatments Recommendations For Services/Supports/Treatments: SAIOP (Substance Abuse Intensive Outpatient Program)  DSM5 Diagnoses: Patient Active Problem List   Diagnosis Date Noted  . Major depressive disorder, single episode, severe (HCC)   . Depression with anxiety 06/13/2012  . ADHD (attention deficit hyperactivity disorder) evaluation 05/30/2012  . Achilles rupture, left 09/25/2011  Disposition:  Per Marciano SequinJanet Sykes, NP, patient does not meet inpatient admission criteria and is appropriate for OP Treatment   Referrals to Alternative Service(s): Referred to Alternative Service(s):   Place:   Date:   Time:    Referred to Alternative Service(s):   Place:   Date:   Time:    Referred to Alternative Service(s):   Place:   Date:   Time:    Referred to Alternative Service(s):   Place:   Date:   Time:     Alfreida Steffenhagen J Derrico Zhong

## 2019-10-07 NOTE — ED Notes (Signed)
Meds given,Vitals rechecked, Infusion restarted.

## 2019-10-07 NOTE — Discharge Instructions (Addendum)
Please drink plenty of fluids.   Call clinic for recheck Return to the ED if you are having worsening shortness of breath or difficulty breathing. Take hydroxyzine as need for anxiety and follow up with a mental health professional when covid has resolved.

## 2019-10-07 NOTE — ED Provider Notes (Signed)
24 yo male here with cp and myalgias.  Covid positive.  Medically stable.  Also, depressed with complaints of suicidal ideation, no HI.  No plan.  No definite diagnosis although historically patient endorses depression. Plan behavioral health No IVC felt to be necessary at this time. Discussed monoclonal antibody therapy with patient.  Patient agrees to proceed Patient states that he was seen by behavioral health and felt stable to go home.  He states that they said that they would request something for his anxiety.  Patient states that he feels safe going home.   Margarita Grizzle, MD 10/07/19 [phone]

## 2019-10-07 NOTE — ED Notes (Signed)
Ativan 1.5mg  wasted with Meriam Sprague, RN

## 2019-10-07 NOTE — ED Notes (Signed)
Wasted 1.5 mg of Ativan with Gwenith Spitz RN

## 2019-10-07 NOTE — ED Notes (Signed)
Infusion completed. VS rechecked.

## 2019-10-07 NOTE — ED Notes (Signed)
Dr Rosalia Hammers has assessed the pt. She recommends a neb and benadryl before restarting infusion.

## 2019-10-07 NOTE — ED Notes (Signed)
Updated EDP on infusion and pt condition.

## 2019-10-07 NOTE — ED Notes (Signed)
Paused infusion, notified EDP of possible reaction. Pt feels lightheaded and depth of respirations increased.

## 2019-10-10 ENCOUNTER — Ambulatory Visit (INDEPENDENT_AMBULATORY_CARE_PROVIDER_SITE_OTHER): Payer: Managed Care, Other (non HMO) | Admitting: Nurse Practitioner

## 2019-10-10 DIAGNOSIS — F419 Anxiety disorder, unspecified: Secondary | ICD-10-CM | POA: Insufficient documentation

## 2019-10-10 DIAGNOSIS — F339 Major depressive disorder, recurrent, unspecified: Secondary | ICD-10-CM

## 2019-10-10 MED ORDER — ESCITALOPRAM OXALATE 10 MG PO TABS: 10.0000 mg | ORAL_TABLET | Freq: Every day | ORAL | 5 refills | Status: AC

## 2019-10-10 NOTE — Progress Notes (Signed)
Virtual Visit via telephone Note Due to COVID-19 pandemic this visit was conducted virtually. This visit type was conducted due to national recommendations for restrictions regarding the COVID-19 Pandemic (e.g. social distancing, sheltering in place) in an effort to limit this patient's exposure and mitigate transmission in our community. All issues noted in this document were discussed and addressed.  A physical exam was not performed with this format.  I connected with Ray Chavez on 10/10/19 at 3:45 by telephone and verified that I am speaking with the correct person using two identifiers. Ray Chavez is currently located at home and no one is currently with home him during visit. The provider, Mary-Margaret Daphine Deutscher, FNP is located in their office at time of visit.  I discussed the limitations, risks, security and privacy concerns of performing an evaluation and management service by telephone and the availability of in person appointments. I also discussed with the patient that there may be a patient responsible charge related to this service. The patient expressed understanding and agreed to proceed.  * he was seen back in middle school and was put on prozac and wellbutrin but only took for 2 years.  History and Present Illness:   Chief Complaint: Anxiety   HPI Patient was diagnosed with covid about 1 week ago. Since then he has been very anxious and depressed. He was given hydroxyzine in the hospital to calm his nerves. He is out of meds now. It did help calm him down though. Depression screen [facility] 2/9 10/07/2019 04/24/2014  Decreased Interest 2 0  Down, Depressed, Hopeless 2 0  PHQ - 2 Score 4 0  Altered sleeping 2 -  Tired, decreased energy 1 -  Change in appetite 2 -  Feeling bad or failure about yourself  2 -  Trouble concentrating 1 -  Moving slowly or fidgety/restless 0 -  Suicidal thoughts 2 -  PHQ-9 Score 14 -  Difficult doing work/chores Very difficult -   GAD 7 :  Generalized Anxiety Score 10/10/2019  Nervous, Anxious, on Edge 2  Control/stop worrying 3  Worry too much - different things 3  Trouble relaxing 2  Restless 0  Easily annoyed or irritable 2  Afraid - awful might happen 2  Total GAD 7 Score 14  Anxiety Difficulty Somewhat difficult       Review of Systems  Constitutional: Negative for diaphoresis and weight loss.  Eyes: Negative for blurred vision, double vision and pain.  Respiratory: Negative for shortness of breath.   Cardiovascular: Negative for chest pain, palpitations, orthopnea and leg swelling.  Gastrointestinal: Negative for abdominal pain.  Skin: Negative for rash.  Neurological: Negative for dizziness, sensory change, loss of consciousness, weakness and headaches.  Endo/Heme/Allergies: Negative for polydipsia. Does not bruise/bleed easily.  Psychiatric/Behavioral: Positive for depression. Negative for memory loss. The patient is nervous/anxious. The patient does not have insomnia.   All other systems reviewed and are negative.    Observations/Objective: Alert and oriented- answers all questions appropriately No distress    Assessment and Plan: Ray Chavez in today with chief complaint of Anxiety   1. Depression, recurrent (HCC) - escitalopram (LEXAPRO) 10 MG tablet; Take 1 tablet (10 mg total) by mouth daily.  Dispense: 30 tablet; Refill: 5  2. Anxiety Stress management Deep breathing exercises - escitalopram (LEXAPRO) 10 MG tablet; Take 1 tablet (10 mg total) by mouth daily.  Dispense: 30 tablet; Refill: 5      Follow Up Instructions: 1 month    I discussed  the assessment and treatment plan with the patient. The patient was provided an opportunity to ask questions and all were answered. The patient agreed with the plan and demonstrated an understanding of the instructions.   The patient was advised to call back or seek an in-person evaluation if the symptoms worsen or if the condition fails to  improve as anticipated.  The above assessment and management plan was discussed with the patient. The patient verbalized understanding of and has agreed to the management plan. Patient is aware to call the clinic if symptoms persist or worsen. Patient is aware when to return to the clinic for a follow-up visit. Patient educated on when it is appropriate to go to the emergency department.   Time call ended:  4:00  I provided 15 minutes of non-face-to-face time during this encounter.    Mary-Margaret Daphine Deutscher, FNP

## 2019-10-19 ENCOUNTER — Ambulatory Visit: Payer: Managed Care, Other (non HMO)

## 2019-11-08 ENCOUNTER — Ambulatory Visit: Payer: Self-pay | Admitting: Nurse Practitioner

## 2019-11-09 ENCOUNTER — Ambulatory Visit: Payer: Self-pay | Admitting: Nurse Practitioner

## 2019-11-13 ENCOUNTER — Encounter: Payer: Self-pay | Admitting: Nurse Practitioner

## 2022-04-29 IMAGING — DX DG CHEST 1V PORT
1 series · 1 of 1 positions shown · non-contrast
Comparison: None.

CLINICAL DATA: Chest pain, shortness of breath, cough, nausea, and
vomiting for 5 days. Negative COVID test last week

EXAM:
PORTABLE CHEST 1 VIEW

[chest ap]
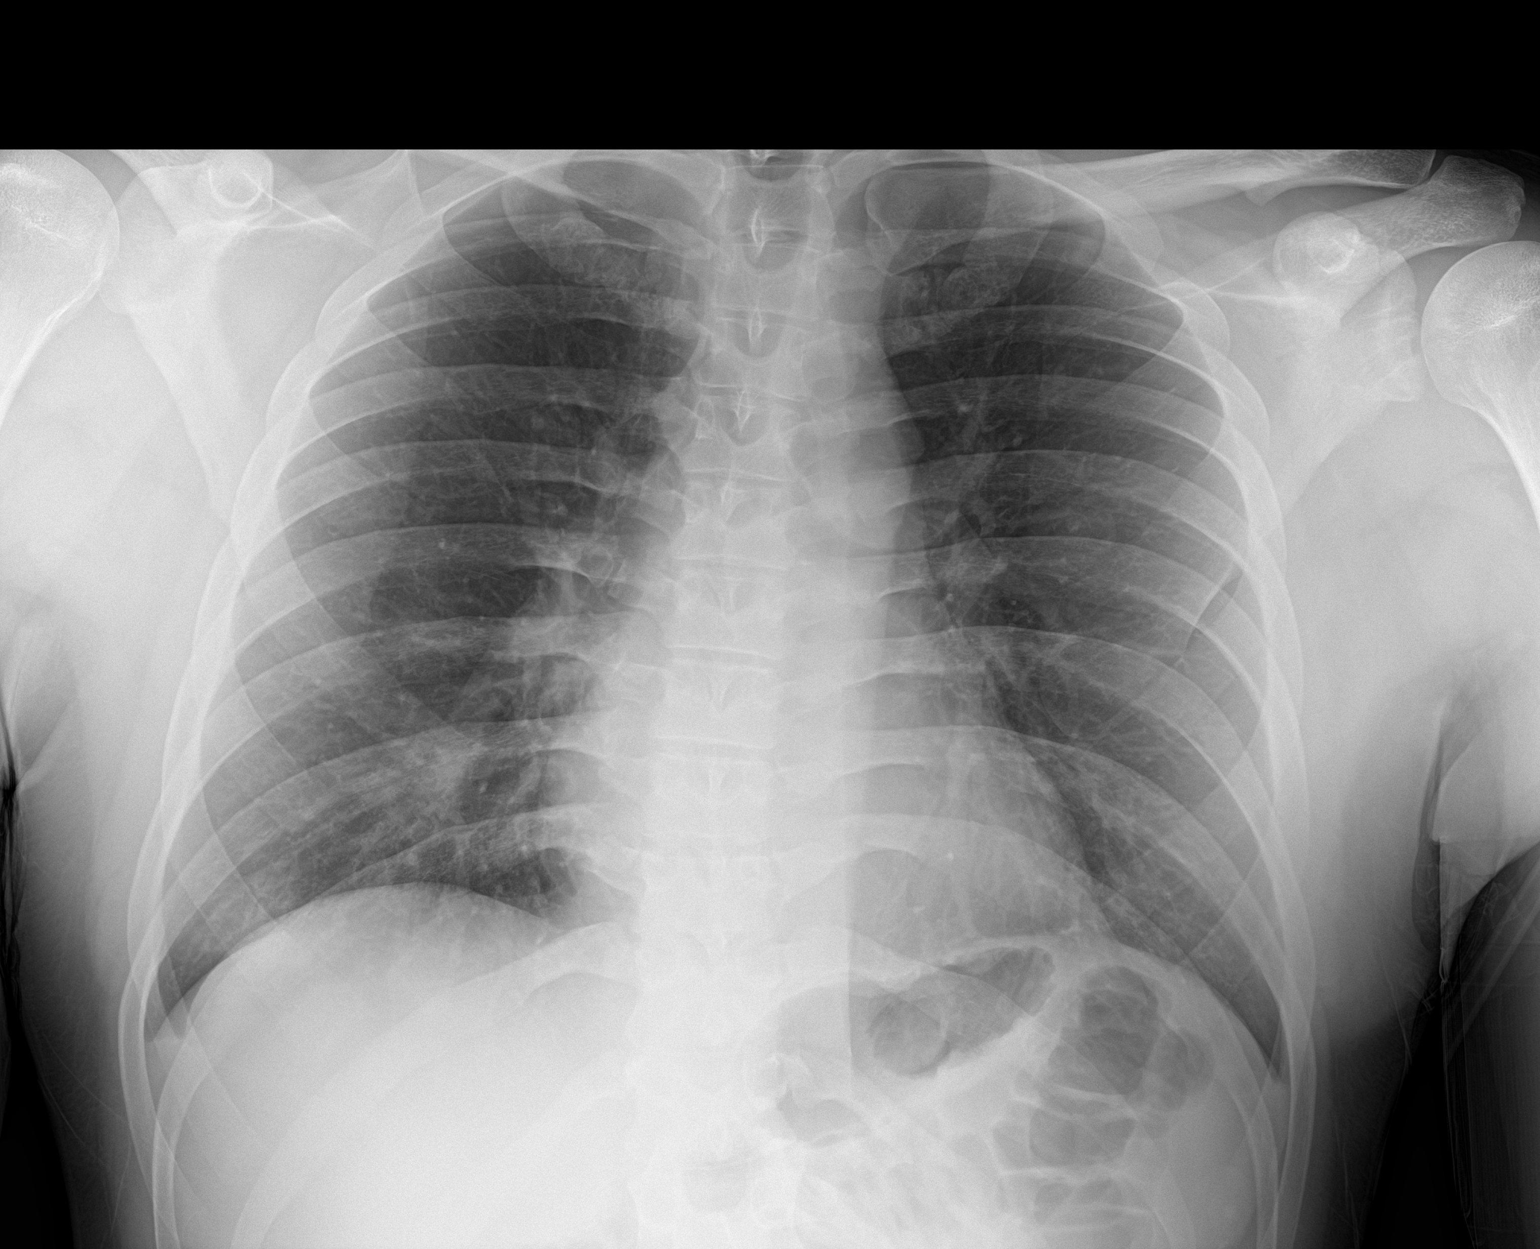

[1 of 1 positions shown; findings below may reference images not displayed]

FINDINGS: Normal heart size and pulmonary vascularity. Suggestion of mild
linear infiltration in the right lung base possibly early pneumonia.
No pleural effusions. Mediastinal contours appear intact.
IMPRESSION: Suggestion of linear infiltration in the right lung base possibly
early pneumonia.
